# Patient Record
Sex: Male | Born: 1980 | Race: White | Hispanic: No | Marital: Single | Smoking: Current every day smoker
Health system: Southern US, Community
[De-identification: ages and names within clinical notes are randomized; demographics above are authoritative.]

---
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
## 2005-08-20 ENCOUNTER — Emergency Department (HOSPITAL_COMMUNITY): Admission: EM | Admit: 2005-08-20 | Discharge: 2005-08-20 | Payer: Self-pay | Admitting: Emergency Medicine

## 2006-09-15 ENCOUNTER — Emergency Department: Payer: Self-pay | Admitting: Emergency Medicine

## 2008-05-03 ENCOUNTER — Emergency Department (HOSPITAL_COMMUNITY): Admission: EM | Admit: 2008-05-03 | Discharge: 2008-05-03 | Payer: Self-pay | Admitting: Family Medicine

## 2008-11-02 ENCOUNTER — Emergency Department (HOSPITAL_COMMUNITY): Admission: EM | Admit: 2008-11-02 | Discharge: 2008-11-02 | Payer: Self-pay | Admitting: Emergency Medicine

## 2011-09-07 LAB — CULTURE, ROUTINE-ABSCESS

## 2011-09-14 LAB — POCT I-STAT, CHEM 8
Calcium, Ion: 1.05 — ABNORMAL LOW
Glucose, Bld: 82
HCT: 53 — ABNORMAL HIGH
Hemoglobin: 18 — ABNORMAL HIGH
Potassium: 3.8

## 2011-09-14 LAB — OCCULT BLOOD X 1 CARD TO LAB, STOOL: Fecal Occult Bld: NEGATIVE

## 2015-08-14 ENCOUNTER — Emergency Department (HOSPITAL_COMMUNITY): Payer: Self-pay

## 2015-08-14 ENCOUNTER — Emergency Department (HOSPITAL_COMMUNITY)
Admission: EM | Admit: 2015-08-14 | Discharge: 2015-08-14 | Disposition: A | Discharge status: Home / Self Care | Payer: Self-pay | Attending: Emergency Medicine | Admitting: Emergency Medicine

## 2015-08-14 ENCOUNTER — Encounter (HOSPITAL_COMMUNITY): Payer: Self-pay | Admitting: Family Medicine

## 2015-08-14 DIAGNOSIS — X58XXXA Exposure to other specified factors, initial encounter: Secondary | ICD-10-CM | POA: Insufficient documentation

## 2015-08-14 DIAGNOSIS — Y998 Other external cause status: Secondary | ICD-10-CM | POA: Insufficient documentation

## 2015-08-14 DIAGNOSIS — Z720 Tobacco use: Secondary | ICD-10-CM | POA: Insufficient documentation

## 2015-08-14 DIAGNOSIS — Y9389 Activity, other specified: Secondary | ICD-10-CM | POA: Insufficient documentation

## 2015-08-14 DIAGNOSIS — S6991XA Unspecified injury of right wrist, hand and finger(s), initial encounter: Secondary | ICD-10-CM | POA: Insufficient documentation

## 2015-08-14 DIAGNOSIS — Y9289 Other specified places as the place of occurrence of the external cause: Secondary | ICD-10-CM | POA: Insufficient documentation

## 2015-08-14 MED ORDER — NAPROXEN 500 MG PO TABS: 500.0000 mg | ORAL_TABLET | Freq: Two times a day (BID) | ORAL | Status: AC
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
## 2015-08-14 NOTE — Discharge Instructions (Signed)
Please read and follow all provided instructions.  Your diagnoses today include:  1. Thumb injury, right, initial encounter     Tests performed today include:  An x-ray of the affected area - does NOT show any new broken bones, shows healed broken bone and arthritis at base of thumb  Vital signs. See below for your results today.   Medications prescribed:   Naproxen - anti-inflammatory pain medication  Do not exceed  naproxen every 12 hours, take with food  You have been prescribed an anti-inflammatory medication or NSAID. Take with food. Take smallest effective dose for the shortest duration needed for your pain. Stop taking if you experience stomach pain or vomiting.   Take any prescribed medications only as directed.  Home care instructions:   Follow any educational materials contained in this packet  Follow R.I.C.E. Protocol:  R - rest your injury   I  - use ice on injury without applying directly to skin  C - compress injury with bandage or splint  E - elevate the injury as much as possible  Follow-up instructions: Please follow-up with the provided orthopedic physician (bone specialist) if you continue to have significant pain in 1 week.  Return instructions:   Please return if your fingers are numb or tingling, appear gray or blue, or you have severe pain (also elevate the arm and loosen splint or wrap if you were given one)  Please return to the Emergency Department if you experience worsening symptoms.   Please return if you have any other emergent concerns.  Additional Information:  Your vital signs today were: BP 121/69 mmHg   Pulse 75   Temp(Src) 97.6 F (36.4 C) (Oral)   Resp 16   Wt 135 lb (61.236 kg)   SpO2 99% If your blood pressure (BP) was elevated above 135/85 this visit, please have this repeated by your doctor within one month. --------------
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
## 2015-08-14 NOTE — ED Notes (Signed)
Pt stable, ambulatory, states understanding of discharge instructions 

## 2018-05-31 ENCOUNTER — Encounter (HOSPITAL_COMMUNITY): Payer: Self-pay | Admitting: Student

## 2018-05-31 ENCOUNTER — Emergency Department (HOSPITAL_COMMUNITY): Payer: Self-pay

## 2018-05-31 ENCOUNTER — Emergency Department (HOSPITAL_COMMUNITY)
Admission: EM | Admit: 2018-05-31 | Discharge: 2018-05-31 | Disposition: A | Discharge status: Home / Self Care | Payer: Self-pay | Attending: Emergency Medicine | Admitting: Emergency Medicine

## 2018-05-31 DIAGNOSIS — R413 Other amnesia: Secondary | ICD-10-CM | POA: Insufficient documentation

## 2018-05-31 DIAGNOSIS — F1490 Cocaine use, unspecified, uncomplicated: Secondary | ICD-10-CM | POA: Insufficient documentation

## 2018-05-31 DIAGNOSIS — Z79899 Other long term (current) drug therapy: Secondary | ICD-10-CM | POA: Insufficient documentation

## 2018-05-31 DIAGNOSIS — F17210 Nicotine dependence, cigarettes, uncomplicated: Secondary | ICD-10-CM | POA: Insufficient documentation

## 2018-05-31 LAB — URINALYSIS, ROUTINE W REFLEX MICROSCOPIC
Bilirubin Urine: NEGATIVE
Glucose, UA: NEGATIVE mg/dL
HGB URINE DIPSTICK: NEGATIVE
Ketones, ur: NEGATIVE mg/dL
Leukocytes, UA: NEGATIVE
Nitrite: NEGATIVE
PH: 8 (ref 5.0–8.0)
Protein, ur: NEGATIVE mg/dL
SPECIFIC GRAVITY, URINE: 1.008 (ref 1.005–1.030)

## 2018-05-31 LAB — CBC
HCT: 48.9 % (ref 39.0–52.0)
Hemoglobin: 17.7 g/dL — ABNORMAL HIGH (ref 13.0–17.0)
MCH: 31.2 pg (ref 26.0–34.0)
MCHC: 36.2 g/dL — ABNORMAL HIGH (ref 30.0–36.0)
MCV: 86.1 fL (ref 78.0–100.0)
PLATELETS: 239 10*3/uL (ref 150–400)
RBC: 5.68 MIL/uL (ref 4.22–5.81)
RDW: 13 % (ref 11.5–15.5)
WBC: 11.7 10*3/uL — AB (ref 4.0–10.5)

## 2018-05-31 LAB — COMPREHENSIVE METABOLIC PANEL
ALK PHOS: 48 U/L (ref 38–126)
ALT: 11 U/L — AB (ref 17–63)
AST: 13 U/L — AB (ref 15–41)
Albumin: 4.2 g/dL (ref 3.5–5.0)
Anion gap: 8 (ref 5–15)
BILIRUBIN TOTAL: 1.4 mg/dL — AB (ref 0.3–1.2)
BUN: 8 mg/dL (ref 6–20)
CALCIUM: 9.2 mg/dL (ref 8.9–10.3)
CO2: 24 mmol/L (ref 22–32)
CREATININE: 1.02 mg/dL (ref 0.61–1.24)
Chloride: 107 mmol/L (ref 101–111)
Glucose, Bld: 99 mg/dL (ref 65–99)
Potassium: 4.4 mmol/L (ref 3.5–5.1)
Sodium: 139 mmol/L (ref 135–145)
TOTAL PROTEIN: 7.2 g/dL (ref 6.5–8.1)

## 2018-05-31 LAB — RAPID URINE DRUG SCREEN, HOSP PERFORMED
Amphetamines: NOT DETECTED
BENZODIAZEPINES: NOT DETECTED
COCAINE: POSITIVE — AB
OPIATES: NOT DETECTED
Tetrahydrocannabinol: POSITIVE — AB

## 2018-05-31 LAB — ETHANOL: Alcohol, Ethyl (B): <10 mg/dL (ref <10)

## 2018-05-31 LAB — ACETAMINOPHEN LEVEL: Acetaminophen (Tylenol), Serum: <10 ug/mL — ABNORMAL LOW (ref 10–30)

## 2018-05-31 LAB — SALICYLATE LEVEL
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
## 2018-05-31 NOTE — ED Notes (Signed)
ED Provider at bedside. 

## 2018-05-31 NOTE — Discharge Instructions (Addendum)
You were seen in the emergency department today for confusion, trouble performing tasks, facial numbness, and drainage from your ear.  Your physical exam, CT, and labs were all reassuring.  The CT scan of your head was completely normal, it did not show any abnormalities.  Your lab work was all reassuring.   The entry of the exact cause of your symptoms, as discussed we feel like you need to follow-up with a primary care provider and possibly see a neurologist.  With this being said we have given you information for both of these specialists in your discharge instructions.  Please stop utilizing marijuana, cocaine, and other substances as this can contribute to your symptoms.   Please call the specialist tomorrow to set up care for primary care and neurology.  Return to the ER for new or worsening symptoms including but not limited to numbness to her entire left side, trouble speaking, loss of consciousness, chest pain, trouble breathing, fevers, thoughts of hurting others or yourself, or any other concerns.

## 2018-05-31 NOTE — ED Triage Notes (Signed)
Patient to ED c/o multiple symptoms that he's had for a long time, but that seem to be happening more frequently - he endorses L sided facial numbness x 1-2 years, unable to think clear at times or function. Patient appears anxious, states he wakes up paranoid because people say his symptoms are stroke-like. No extremity weakness, no speech or vision changes, no facial droop. A&O x 4. Neuro intact.
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
## 2018-05-31 NOTE — ED Notes (Signed)
Pt given drink, states he is unable to urinate right now

## 2019-02-18 ENCOUNTER — Other Ambulatory Visit: Payer: Self-pay

## 2019-02-18 ENCOUNTER — Encounter (HOSPITAL_COMMUNITY): Payer: Self-pay

## 2019-02-18 ENCOUNTER — Emergency Department (HOSPITAL_COMMUNITY)
Admission: EM | Admit: 2019-02-18 | Discharge: 2019-02-19 | Disposition: A | Discharge status: Home / Self Care | Payer: Self-pay | Attending: Emergency Medicine | Admitting: Emergency Medicine

## 2019-02-18 DIAGNOSIS — M549 Dorsalgia, unspecified: Secondary | ICD-10-CM | POA: Insufficient documentation

## 2019-02-18 DIAGNOSIS — Z5321 Procedure and treatment not carried out due to patient leaving prior to being seen by health care provider: Secondary | ICD-10-CM | POA: Insufficient documentation

## 2019-02-18 NOTE — ED Triage Notes (Signed)
Pt arrives POV for eval of R armpit pain d/t abscess, L elbow pain d/t cyst x years (but reports worse this week) and generalized back pain. Pt reports back pain feels like "someone has inserted multiple knives up his rectum and then allowed them to dangle there so the pain travels up and down".

## 2019-02-19 NOTE — ED Notes (Signed)
Pt.left  Due to wait time

## 2019-12-31 IMAGING — CT CT HEAD W/O CM
4 series · 16 of 47 positions shown, 18 images · non-contrast
Comparison: None.

CLINICAL DATA: Chronic left-sided facial numbness for the past 1-2
years.

EXAM:
CT HEAD WITHOUT CONTRAST
TECHNIQUE: Contiguous axial images were obtained from the base of the skull
through the vertex without intravenous contrast.

[Series 3: head wo · axial · 0.41mm/px · z∈[-114,+6]mm · 7 of 33 slices shown, 9 images]
[im 5/33  brain]
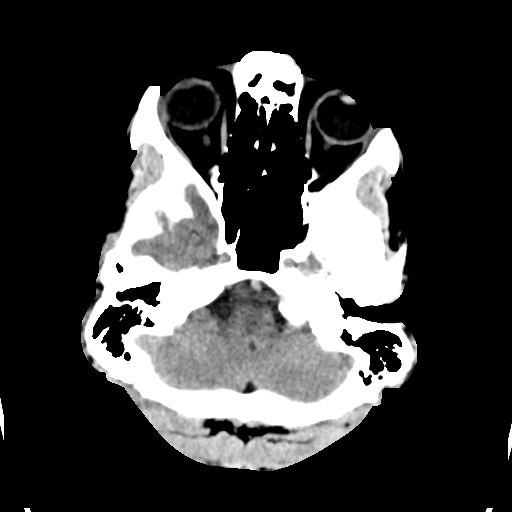
[im 5/33  bone]
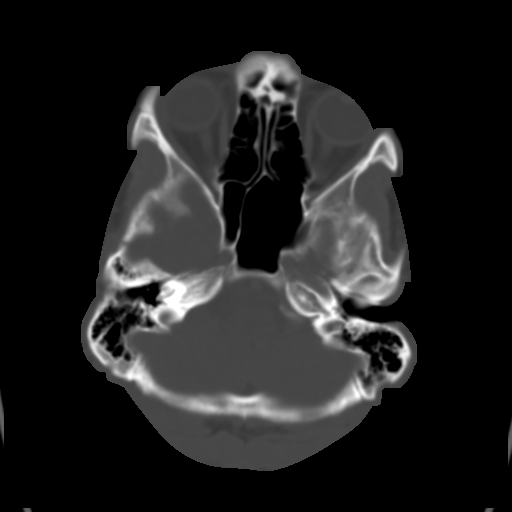
[im 9/33  brain]
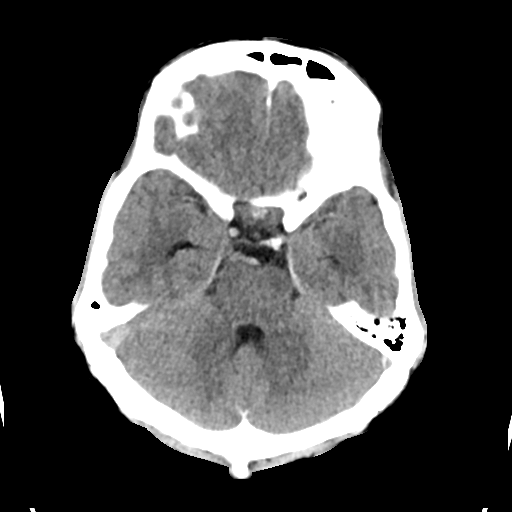
[im 13/33  brain]
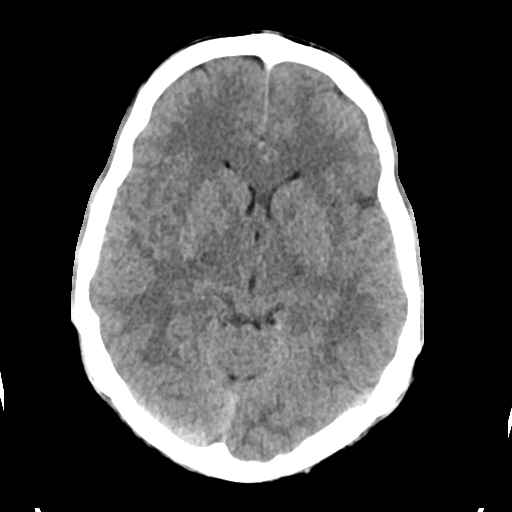
[im 17/33  brain]
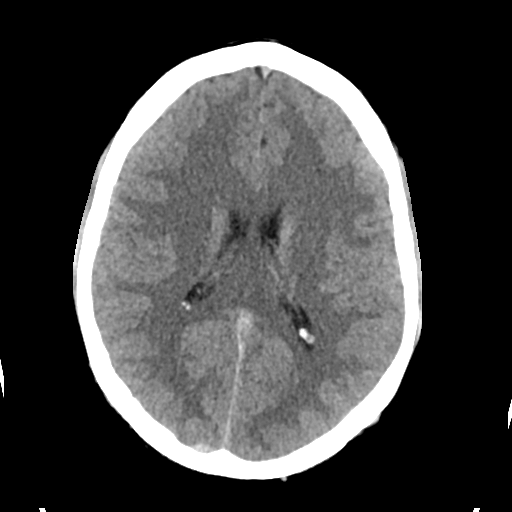
[im 21/33  brain]
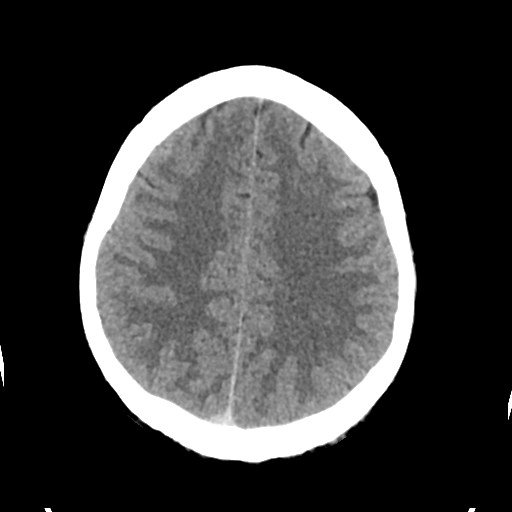
[im 21/33  bone]
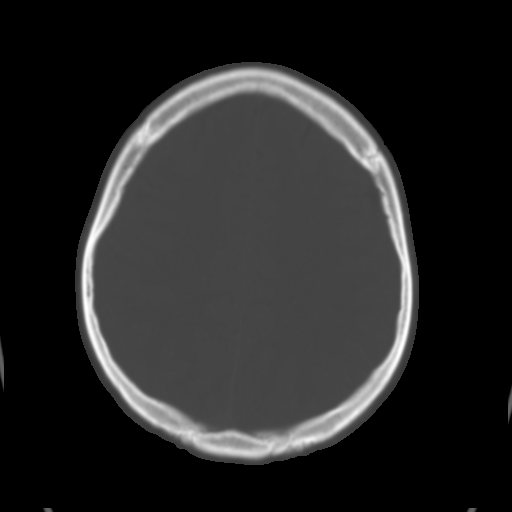
[im 25/33  brain]
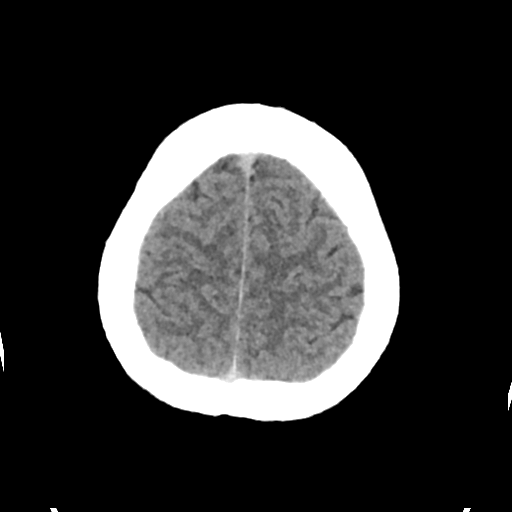
[im 29/33  brain]
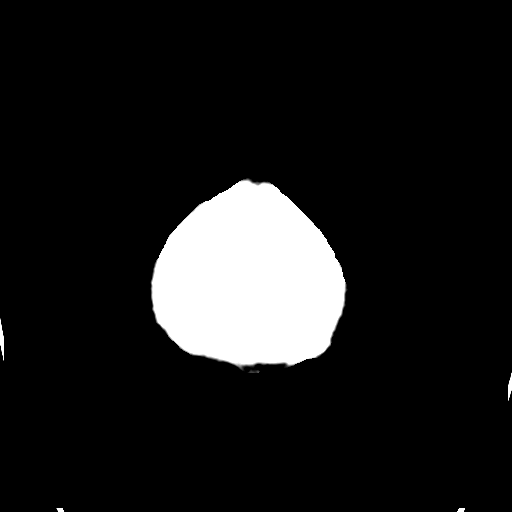

[Series 4: head bone · axial · 0.41mm/px · z∈[-118,-86]mm · 3 of 81 slices shown]
[im 9/81  bone]
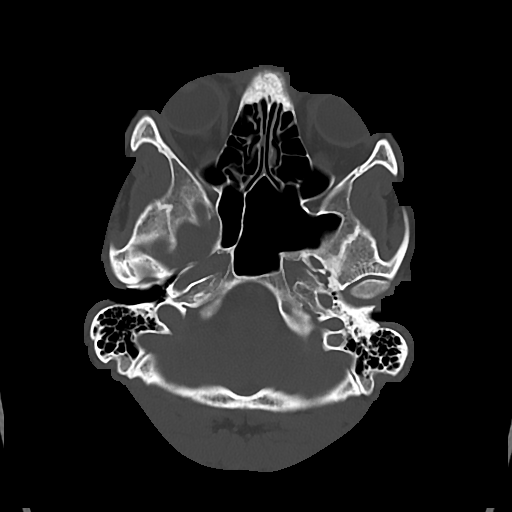
[im 17/81  bone]
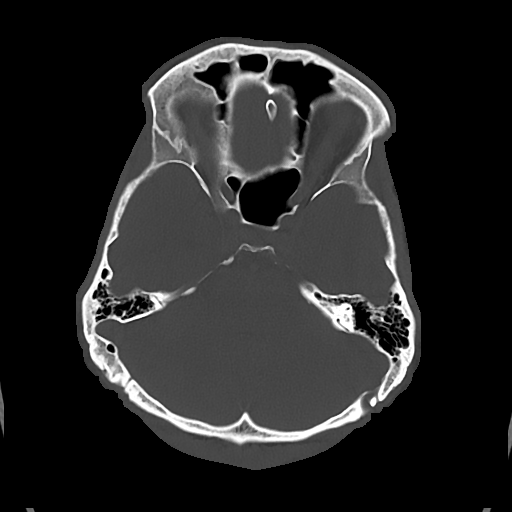
[im 25/81  bone]
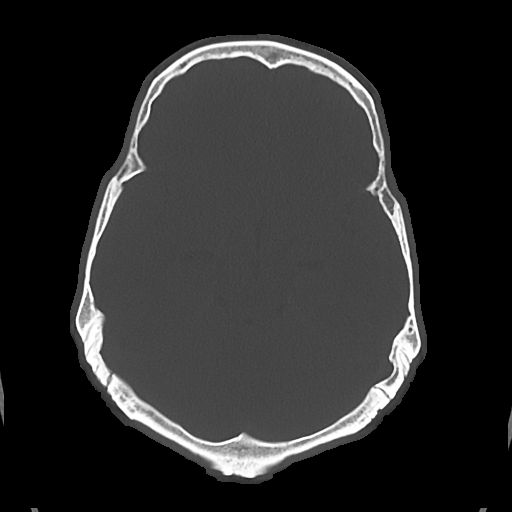

[Series 5: cor soft · coronal · 0.33mm/px · 3 of 69 slices shown]
[im 23/69  brain]
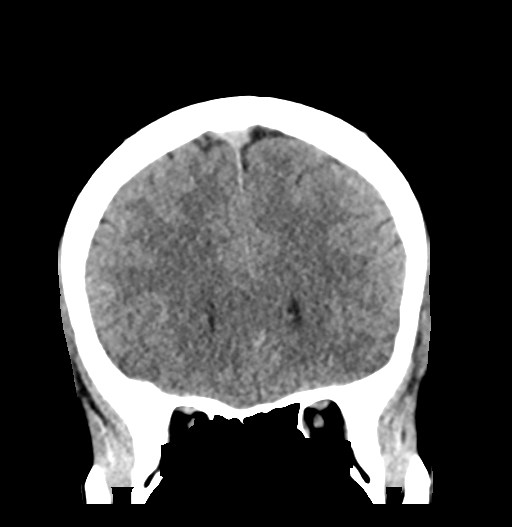
[im 31/69  brain]
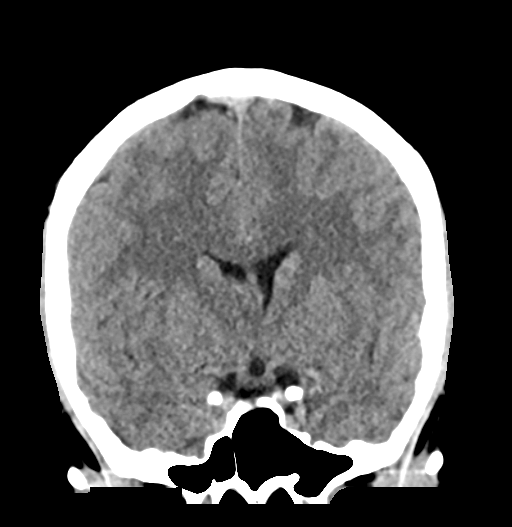
[im 38/69  brain]
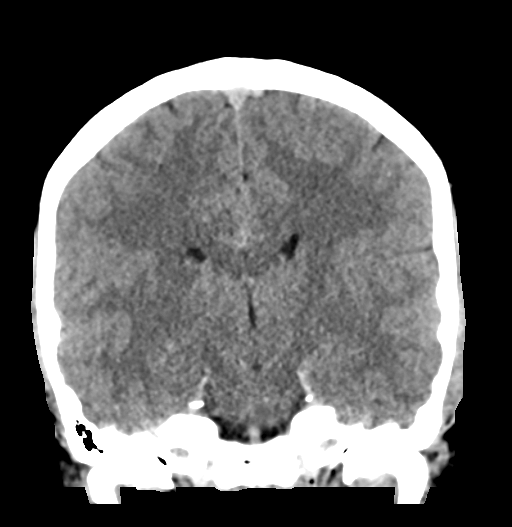

[Series 6: sag soft · sagittal · 0.31mm/px · 3 of 52 slices shown]
[im 18/52  brain]
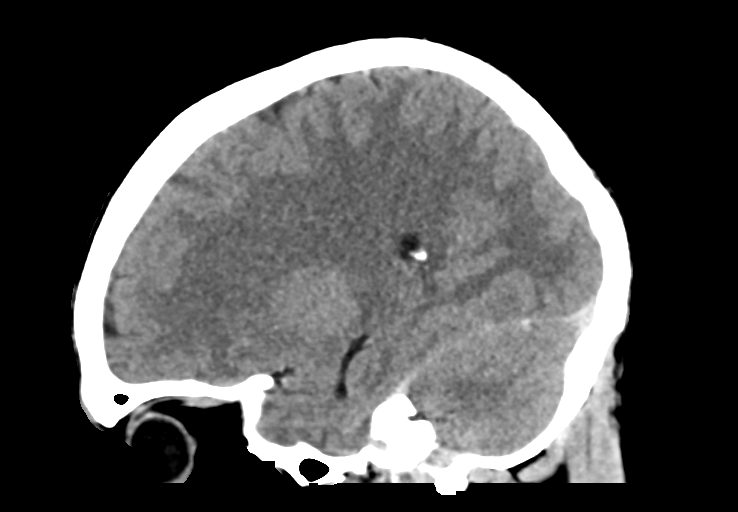
[im 26/52  brain]
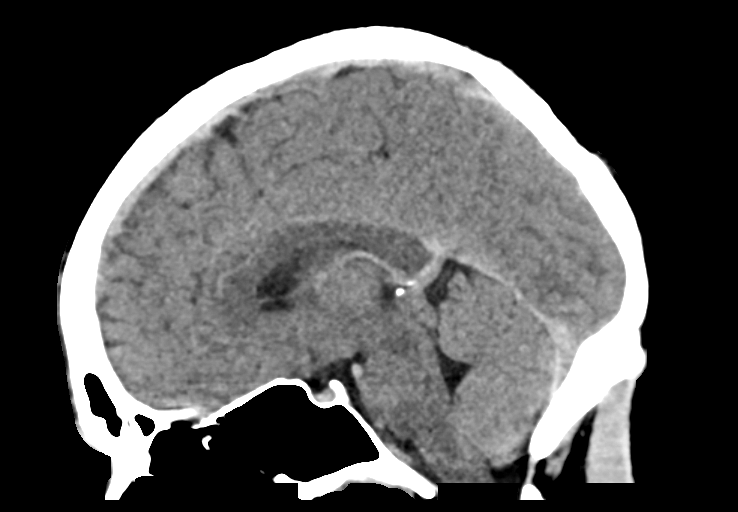
[im 35/52  brain]
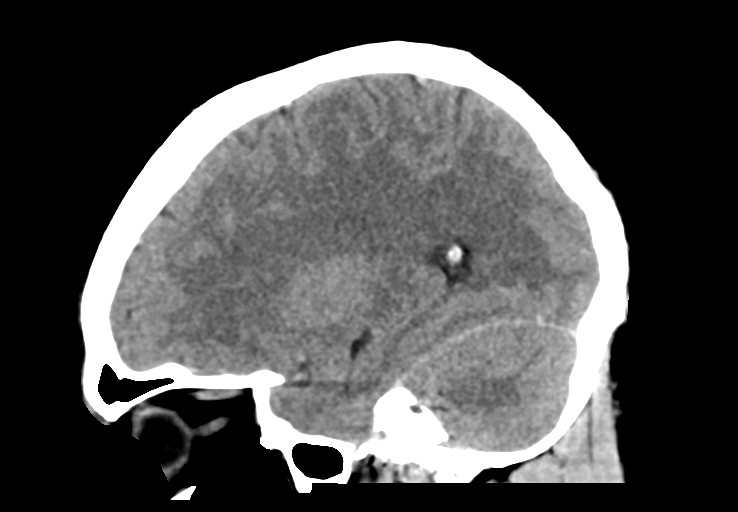

[16 of 47 positions shown; findings below may reference images not displayed]

FINDINGS: Brain: No evidence of acute infarction, hemorrhage, hydrocephalus,
extra-axial collection or mass lesion/mass effect.

Vascular: No hyperdense vessel or unexpected calcification.

Skull: Normal. Negative for fracture or focal lesion.

Sinuses/Orbits: No acute finding.

Other: None.
IMPRESSION: 1. Normal noncontrast head CT.

## 2022-09-19 ENCOUNTER — Ambulatory Visit: Payer: Self-pay | Admitting: Nurse Practitioner

## 2022-11-17 ENCOUNTER — Ambulatory Visit (INDEPENDENT_AMBULATORY_CARE_PROVIDER_SITE_OTHER): Payer: Self-pay | Admitting: Primary Care

## 2024-10-12 DEATH — deceased
